# Patient Record
Sex: Male | Born: 1990 | Race: Black or African American | Hispanic: No | Marital: Single
Health system: Southern US, Community
[De-identification: ages and names within clinical notes are randomized; demographics above are authoritative.]

---

## 2015-09-01 ENCOUNTER — Emergency Department (HOSPITAL_COMMUNITY)
Admission: EM | Admit: 2015-09-01 | Discharge: 2015-09-12 | Disposition: E | Payer: Self-pay | Attending: Emergency Medicine | Admitting: Emergency Medicine

## 2015-09-01 DIAGNOSIS — Y929 Unspecified place or not applicable: Secondary | ICD-10-CM | POA: Insufficient documentation

## 2015-09-01 DIAGNOSIS — W3400XA Accidental discharge from unspecified firearms or gun, initial encounter: Secondary | ICD-10-CM

## 2015-09-01 DIAGNOSIS — Y999 Unspecified external cause status: Secondary | ICD-10-CM | POA: Insufficient documentation

## 2015-09-01 DIAGNOSIS — Y939 Activity, unspecified: Secondary | ICD-10-CM | POA: Insufficient documentation

## 2015-09-01 DIAGNOSIS — S21302A Unspecified open wound of left front wall of thorax with penetration into thoracic cavity, initial encounter: Secondary | ICD-10-CM | POA: Insufficient documentation

## 2015-09-01 DIAGNOSIS — S21332A Puncture wound without foreign body of left front wall of thorax with penetration into thoracic cavity, initial encounter: Secondary | ICD-10-CM

## 2015-09-01 MED ORDER — ETOMIDATE 2 MG/ML IV SOLN
INTRAVENOUS | Status: AC | PRN
  Administered 2015-09-01: 20 mg via INTRAVENOUS

## 2015-09-01 MED ORDER — ROCURONIUM BROMIDE 50 MG/5ML IV SOLN
INTRAVENOUS | Status: AC | PRN
  Administered 2015-09-01: 70 mg via INTRAVENOUS

## 2015-09-01 MED ORDER — SODIUM CHLORIDE 0.9 % IV SOLN
INTRAVENOUS | Status: AC | PRN
  Administered 2015-09-01 – 2015-09-02 (×3): 1000 mL via INTRAVENOUS
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
## 2015-09-02 ENCOUNTER — Emergency Department (HOSPITAL_COMMUNITY): Payer: Self-pay

## 2015-09-02 ENCOUNTER — Emergency Department (HOSPITAL_COMMUNITY): Payer: Self-pay | Admitting: Certified Registered"

## 2015-09-02 ENCOUNTER — Encounter (HOSPITAL_COMMUNITY): Admission: EM | Disposition: E | Payer: Self-pay | Source: Home / Self Care | Attending: Emergency Medicine

## 2015-09-02 ENCOUNTER — Encounter (HOSPITAL_COMMUNITY): Payer: Self-pay | Admitting: Certified Registered"

## 2015-09-02 DIAGNOSIS — S271XXA Traumatic hemothorax, initial encounter: Secondary | ICD-10-CM

## 2015-09-02 HISTORY — PX: ASD REPAIR: SHX258

## 2015-09-02 LAB — PREPARE CRYOPRECIPITATE
UNIT DIVISION: 0
Unit division: 0

## 2015-09-02 LAB — COMPREHENSIVE METABOLIC PANEL
ALBUMIN: 4.8 g/dL (ref 3.5–5.0)
ALK PHOS: 47 U/L (ref 38–126)
ALT: 23 U/L (ref 17–63)
AST: 32 U/L (ref 15–41)
Anion gap: 14 (ref 5–15)
BILIRUBIN TOTAL: 0.8 mg/dL (ref 0.3–1.2)
BUN: 26 mg/dL — AB (ref 6–20)
CALCIUM: 9.8 mg/dL (ref 8.9–10.3)
CO2: 22 mmol/L (ref 22–32)
CREATININE: 1.04 mg/dL (ref 0.61–1.24)
Chloride: 102 mmol/L (ref 101–111)
GFR calc Af Amer: >60 mL/min (ref >60)
GLUCOSE: 86 mg/dL (ref 65–99)
POTASSIUM: 4.1 mmol/L (ref 3.5–5.1)
Sodium: 138 mmol/L (ref 135–145)
TOTAL PROTEIN: 6.9 g/dL (ref 6.5–8.1)

## 2015-09-02 LAB — CBC
HCT: 42.2 % (ref 39.0–52.0)
Hemoglobin: 13.8 g/dL (ref 13.0–17.0)
MCH: 30.6 pg (ref 26.0–34.0)
MCHC: 32.7 g/dL (ref 30.0–36.0)
MCV: 93.6 fL (ref 78.0–100.0)
PLATELETS: 207 10*3/uL (ref 150–400)
RBC: 4.51 MIL/uL (ref 4.22–5.81)
RDW: 13.1 % (ref 11.5–15.5)
WBC: 13 10*3/uL — AB (ref 4.0–10.5)

## 2015-09-02 LAB — CDS SEROLOGY

## 2015-09-02 LAB — ABO/RH: ABO/RH(D): O POS

## 2015-09-02 LAB — PROTIME-INR
INR: 1.57 — AB (ref 0.00–1.49)
PROTHROMBIN TIME: 18.8 s — AB (ref 11.6–15.2)

## 2015-09-02 LAB — ETHANOL

## 2015-09-02 SURGERY — REPAIR, ATRIAL SEPTAL DEFECT
Anesthesia: General | Site: Chest

## 2015-09-02 MED ORDER — NITROGLYCERIN IN D5W 200-5 MCG/ML-% IV SOLN
2.0000 ug/min | INTRAVENOUS | Status: DC
  Filled 2015-09-02: qty 250

## 2015-09-02 MED ORDER — PAPAVERINE HCL 30 MG/ML IJ SOLN
INTRAMUSCULAR | Status: DC
  Filled 2015-09-02: qty 2.5

## 2015-09-02 MED ORDER — INSULIN REGULAR HUMAN 100 UNIT/ML IJ SOLN
INTRAMUSCULAR | Status: DC
  Filled 2015-09-02: qty 2.5

## 2015-09-02 MED ORDER — CALCIUM CHLORIDE 10 % IV SOLN
INTRAVENOUS | Status: DC | PRN
  Administered 2015-09-02: 1 g via INTRAVENOUS

## 2015-09-02 MED ORDER — EPINEPHRINE HCL 1 MG/ML IJ SOLN
INTRAMUSCULAR | Status: DC | PRN
  Administered 2015-09-02 (×7): 1 mg via INTRAVENOUS

## 2015-09-02 MED ORDER — SODIUM BICARBONATE 4.2 % IV SOLN
INTRAVENOUS | Status: DC | PRN
  Administered 2015-09-02: 50 meq via INTRAVENOUS
  Administered 2015-09-02: 100 meq via INTRAVENOUS

## 2015-09-02 MED ORDER — PHENYLEPHRINE HCL 10 MG/ML IJ SOLN
30.0000 ug/min | INTRAVENOUS | Status: DC
  Filled 2015-09-02: qty 2

## 2015-09-02 MED ORDER — 0.9 % SODIUM CHLORIDE (POUR BTL) OPTIME
TOPICAL | Status: DC | PRN
  Administered 2015-09-02: 3000 mL

## 2015-09-02 MED ORDER — SODIUM BICARBONATE 8.4 % IV SOLN
INTRAVENOUS | Status: AC | PRN
  Administered 2015-09-02 (×2): 50 meq via INTRAVENOUS

## 2015-09-02 MED ORDER — HEPARIN SODIUM (PORCINE) 1000 UNIT/ML IJ SOLN
INTRAMUSCULAR | Status: DC
  Filled 2015-09-02: qty 30

## 2015-09-02 MED ORDER — VANCOMYCIN HCL 10 G IV SOLR
1250.0000 mg | INTRAVENOUS | Status: DC
  Filled 2015-09-02: qty 1250

## 2015-09-02 MED ORDER — DOPAMINE-DEXTROSE 3.2-5 MG/ML-% IV SOLN
0.0000 ug/kg/min | INTRAVENOUS | Status: DC
  Filled 2015-09-02: qty 250

## 2015-09-02 MED ORDER — EPINEPHRINE HCL 0.1 MG/ML IJ SOSY
PREFILLED_SYRINGE | INTRAMUSCULAR | Status: AC | PRN
  Administered 2015-09-02 (×7): 1 via INTRAVENOUS

## 2015-09-02 MED ORDER — EPINEPHRINE HCL 1 MG/ML IJ SOLN
0.0000 ug/min | INTRAMUSCULAR | Status: DC
Start: 2015-09-02 — End: 2015-09-02
  Filled 2015-09-02: qty 4

## 2015-09-02 MED ORDER — MAGNESIUM SULFATE 50 % IJ SOLN
40.0000 meq | INTRAMUSCULAR | Status: DC
  Filled 2015-09-02: qty 10

## 2015-09-02 MED ORDER — FENTANYL CITRATE (PF) 250 MCG/5ML IJ SOLN
INTRAMUSCULAR | Status: AC
  Filled 2015-09-02: qty 5

## 2015-09-02 MED ORDER — DEXTROSE 5 % IV SOLN
750.0000 mg | INTRAVENOUS | Status: DC
  Filled 2015-09-02: qty 750

## 2015-09-02 MED ORDER — MIDAZOLAM HCL 2 MG/2ML IJ SOLN
INTRAMUSCULAR | Status: AC
  Filled 2015-09-02: qty 2

## 2015-09-02 MED ORDER — SODIUM CHLORIDE 0.9 % IV SOLN
INTRAVENOUS | Status: DC | PRN
  Administered 2015-09-02: 01:00:00 via INTRAVENOUS

## 2015-09-02 MED ORDER — FENTANYL CITRATE (PF) 100 MCG/2ML IJ SOLN
INTRAMUSCULAR | Status: AC | PRN
  Administered 2015-09-02: 50 ug via INTRAVENOUS

## 2015-09-02 MED ORDER — PROPOFOL 10 MG/ML IV BOLUS
INTRAVENOUS | Status: AC
  Filled 2015-09-02: qty 20

## 2015-09-02 MED ORDER — DEXMEDETOMIDINE HCL IN NACL 400 MCG/100ML IV SOLN
0.1000 ug/kg/h | INTRAVENOUS | Status: DC
  Filled 2015-09-02: qty 100

## 2015-09-02 MED ORDER — POTASSIUM CHLORIDE 2 MEQ/ML IV SOLN
80.0000 meq | INTRAVENOUS | Status: DC
  Filled 2015-09-02: qty 40

## 2015-09-02 MED ORDER — AMINOCAPROIC ACID 250 MG/ML IV SOLN
INTRAVENOUS | Status: DC
  Filled 2015-09-02: qty 40

## 2015-09-02 MED ORDER — DEXTROSE 5 % IV SOLN
1.5000 g | INTRAVENOUS | Status: DC
  Filled 2015-09-02: qty 1.5

## 2015-09-02 MED FILL — Sodium Chloride IV Soln 0.9%: INTRAVENOUS | Qty: 3000 | Status: AC

## 2015-09-02 MED FILL — Medication: Qty: 1 | Status: AC

## 2015-09-02 MED FILL — Heparin Sodium (Porcine) Inj 1000 Unit/ML: INTRAMUSCULAR | Qty: 30 | Status: AC

## 2015-09-02 SURGICAL SUPPLY — 86 items
ADAPTER CARDIO PERF ANTE/RETRO (ADAPTER) ×3 IMPLANT
BAG DECANTER FOR FLEXI CONT (MISCELLANEOUS) ×3 IMPLANT
BLADE STERNUM SYSTEM 6 (BLADE) ×6 IMPLANT
BLADE SURG 15 STRL LF DISP TIS (BLADE) ×1 IMPLANT
BLADE SURG 15 STRL SS (BLADE) ×2
BLADE SURG ROTATE 9660 (MISCELLANEOUS) IMPLANT
CANISTER SUCTION 2500CC (MISCELLANEOUS) ×3 IMPLANT
CANNULA ARTERIAL NVNT 3/8 22FR (MISCELLANEOUS) IMPLANT
CANNULA GUNDRY RCSP 15FR (MISCELLANEOUS) IMPLANT
CARDIOBLATE CARDIAC ABLATION (MISCELLANEOUS)
CATH HEART VENT LEFT (CATHETERS) ×1 IMPLANT
CATH RETROPLEGIA CORONARY 14FR (CATHETERS) IMPLANT
CATH ROBINSON RED A/P 18FR (CATHETERS) IMPLANT
CLIP TI MEDIUM 24 (CLIP) ×6 IMPLANT
CLIP TI WIDE RED SMALL 24 (CLIP) ×9 IMPLANT
CONN 1/2X1/2X1/2  BEN (MISCELLANEOUS) ×2
CONN 1/2X1/2X1/2 BEN (MISCELLANEOUS) ×1 IMPLANT
CONN 3/8X1/2 ST GISH (MISCELLANEOUS) ×6 IMPLANT
CONN Y 3/8X3/8X3/8  BEN (MISCELLANEOUS)
CONN Y 3/8X3/8X3/8 BEN (MISCELLANEOUS) IMPLANT
CRADLE DONUT ADULT HEAD (MISCELLANEOUS) ×3 IMPLANT
DEVICE CARDIOBLATE CARDIAC ABL (MISCELLANEOUS) IMPLANT
DRAPE CARDIOVASCULAR INCISE (DRAPES) ×2
DRAPE SLUSH/WARMER DISC (DRAPES) IMPLANT
DRAPE SRG 135X102X78XABS (DRAPES) ×1 IMPLANT
DRSG AQUACEL AG ADV 3.5X14 (GAUZE/BANDAGES/DRESSINGS) ×3 IMPLANT
ELECT CAUTERY BLADE 6.4 (BLADE) IMPLANT
ELECT REM PT RETURN 9FT ADLT (ELECTROSURGICAL) ×6
ELECTRODE REM PT RTRN 9FT ADLT (ELECTROSURGICAL) ×2 IMPLANT
GAUZE SPONGE 4X4 12PLY STRL (GAUZE/BANDAGES/DRESSINGS) ×6 IMPLANT
GLOVE BIO SURGEON STRL SZ 6 (GLOVE) ×3 IMPLANT
GOWN STRL REUS W/ TWL LRG LVL3 (GOWN DISPOSABLE) ×4 IMPLANT
GOWN STRL REUS W/TWL LRG LVL3 (GOWN DISPOSABLE) ×8
HEMOSTAT POWDER SURGIFOAM 1G (HEMOSTASIS) IMPLANT
INSERT FOGARTY XLG (MISCELLANEOUS) IMPLANT
KIT BASIN OR (CUSTOM PROCEDURE TRAY) ×3 IMPLANT
KIT ROOM TURNOVER OR (KITS) ×3 IMPLANT
KIT SUCTION CATH 14FR (SUCTIONS) IMPLANT
NS IRRIG 1000ML POUR BTL (IV SOLUTION) ×12 IMPLANT
PACK OPEN HEART (CUSTOM PROCEDURE TRAY) ×3 IMPLANT
PAD ARMBOARD 7.5X6 YLW CONV (MISCELLANEOUS) ×6 IMPLANT
STAPLER VISISTAT 35W (STAPLE) ×3 IMPLANT
STOPCOCK 4 WAY LG BORE MALE ST (IV SETS) ×6 IMPLANT
SUCKER WEIGHTED FLEX (MISCELLANEOUS) ×3 IMPLANT
SUT ETHIBOND 2 0 SH (SUTURE) ×11 IMPLANT
SUT ETHIBOND 2 0 SH 36X2 (SUTURE) ×4 IMPLANT
SUT ETHIBOND 2 0 V4 (SUTURE) ×3 IMPLANT
SUT ETHIBOND 2 0V4 GREEN (SUTURE) ×6 IMPLANT
SUT PROLENE 3 0 SH DA (SUTURE) ×3 IMPLANT
SUT PROLENE 3 0 SH1 36 (SUTURE) ×3 IMPLANT
SUT PROLENE 4 0 RB 1 (SUTURE) ×10
SUT PROLENE 4-0 RB1 .5 CRCL 36 (SUTURE) ×5 IMPLANT
SUT PROLENE 5 0 C 1 36 (SUTURE) ×12 IMPLANT
SUT PROLENE 5 0 CC1 (SUTURE) ×3 IMPLANT
SUT PROLENE 6 0 C 1 30 (SUTURE) ×6 IMPLANT
SUT PROLENE 8 0 BV175 6 (SUTURE) ×6 IMPLANT
SUT PROLENE BLUE 7 0 (SUTURE) ×3 IMPLANT
SUT SILK  1 MH (SUTURE) ×8
SUT SILK 1 MH (SUTURE) ×4 IMPLANT
SUT SILK 1 TIES 10X30 (SUTURE) ×6 IMPLANT
SUT SILK 2 0 (SUTURE) ×2
SUT SILK 2 0 SH CR/8 (SUTURE) ×12 IMPLANT
SUT SILK 2 0 TIES 10X30 (SUTURE) ×3 IMPLANT
SUT SILK 2 0 TIES 17X18 (SUTURE) ×2
SUT SILK 2-0 18XBRD TIE 12 (SUTURE) ×1 IMPLANT
SUT SILK 2-0 18XBRD TIE BLK (SUTURE) ×1 IMPLANT
SUT SILK 3 0 SH CR/8 (SUTURE) ×6 IMPLANT
SUT SILK 4 0 (SUTURE) ×2
SUT SILK 4 0 TIE 10X30 (SUTURE) ×6 IMPLANT
SUT SILK 4-0 18XBRD TIE 12 (SUTURE) ×1 IMPLANT
SUT STEEL 6MS V (SUTURE) IMPLANT
SUT TEM PAC WIRE 2 0 SH (SUTURE) ×12 IMPLANT
SUT VIC AB 1 CTX 36 (SUTURE) ×4
SUT VIC AB 1 CTX36XBRD ANBCTR (SUTURE) ×2 IMPLANT
SUT VIC AB 2-0 CTX 27 (SUTURE) ×6 IMPLANT
SUT VIC AB 3-0 X1 27 (SUTURE) ×6 IMPLANT
SUT VICRYL 2 TP 1 (SUTURE) ×3 IMPLANT
SYSTEM SAHARA CHEST DRAIN ATS (WOUND CARE) ×3 IMPLANT
TAPE UMBILICAL COTTON 1/8X30 (MISCELLANEOUS) ×3 IMPLANT
TOWEL OR 17X24 6PK STRL BLUE (TOWEL DISPOSABLE) ×3 IMPLANT
TOWEL OR 17X26 10 PK STRL BLUE (TOWEL DISPOSABLE) ×3 IMPLANT
TRAY FOLEY IC TEMP SENS 14FR (CATHETERS) ×3 IMPLANT
TUBING ART PRESS 48 MALE/FEM (TUBING) ×3 IMPLANT
UNDERPAD 30X30 INCONTINENT (UNDERPADS AND DIAPERS) ×3 IMPLANT
VENT LEFT HEART 12002 (CATHETERS) ×3
WATER STERILE IRR 1000ML POUR (IV SOLUTION) ×6 IMPLANT

## 2015-09-03 ENCOUNTER — Encounter (HOSPITAL_COMMUNITY): Payer: Self-pay | Admitting: Cardiothoracic Surgery

## 2015-09-03 LAB — PREPARE FRESH FROZEN PLASMA
UNIT DIVISION: 0
UNIT DIVISION: 0
UNIT DIVISION: 0
UNIT DIVISION: 0
UNIT DIVISION: 0
Unit division: 0
Unit division: 0
Unit division: 0
Unit division: 0
Unit division: 0
Unit division: 0
Unit division: 0

## 2015-09-03 LAB — TYPE AND SCREEN
ABO/RH(D): O POS
Antibody Screen: NEGATIVE
UNIT DIVISION: 0
UNIT DIVISION: 0
UNIT DIVISION: 0
UNIT DIVISION: 0
UNIT DIVISION: 0
UNIT DIVISION: 0
UNIT DIVISION: 0
UNIT DIVISION: 0
Unit division: 0
Unit division: 0
Unit division: 0
Unit division: 0
Unit division: 0
Unit division: 0

## 2015-09-03 LAB — PREPARE PLATELET PHERESIS
Unit division: 0
Unit division: 0
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
## 2015-09-12 NOTE — Code Documentation (Signed)
UNIT #5 PRBC STARTED UNIT [id number]

## 2015-09-12 NOTE — OR Nursing (Signed)
Time of death 35 called by Dr. Donata Clay. Medical Examiner notified.  Donor notified.

## 2015-09-12 NOTE — Code Documentation (Signed)
Pulse check; pulse present  

## 2015-09-12 NOTE — ED Notes (Signed)
1ST UNIT PLASMA STARTED UNIT # [id number]

## 2015-09-12 NOTE — OR Nursing (Signed)
[facility] Reference number [id number]. Spoke to [employer].

## 2015-09-12 NOTE — Code Documentation (Signed)
  EPI GIVEN INTO HEART

## 2015-09-12 NOTE — Code Documentation (Signed)
CHEST OPENED
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
## 2015-09-12 NOTE — ED Notes (Signed)
2ND UNIT BLOOD STARTED UNIT #[id number]

## 2015-09-12 NOTE — ED Notes (Signed)
800 CC BRIGHT RED BLOOD OUT OF PLEUROVAC.
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
## 2015-09-12 NOTE — ED Notes (Signed)
2ND UNIT PLASMA FINISHED

## 2015-09-12 NOTE — Code Documentation (Signed)
3RD UNIT PLASMA STARTED UNIT W [id number] 

## 2015-09-12 NOTE — Code Documentation (Signed)
5TH UNIT PRBC FINISHED

## 2015-09-12 NOTE — Anesthesia Postprocedure Evaluation (Signed)
  Anesthesia Post-op Note  Patient: Victor Gay XXXYaacoub Babikir  Procedure(s) Performed: Procedure(s): Chest exploration (N/A)     Time of death 14

## 2015-09-12 NOTE — Code Documentation (Signed)
EPI GIVEN INTO HEART
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
## 2015-09-12 NOTE — Code Documentation (Signed)
3RD UNIT PLASMA STARTED UNIT W [id number]

## 2015-09-12 NOTE — ED Notes (Signed)
2ND UNIT PRBC COMPLETE

## 2015-09-12 NOTE — ED Notes (Signed)
Paged Cardiothoracic surg to [phone] @ [phone]

## 2015-09-12 NOTE — ED Notes (Signed)
1ST UNIT BLOOD FINISHED.

## 2015-09-12 NOTE — Code Documentation (Signed)
PLASMA #4FINISHED

## 2015-09-12 NOTE — Anesthesia Preprocedure Evaluation (Addendum)
Anesthesia Evaluation  Patient identified by MRN, date of birth, ID band Patient unresponsive    Reviewed: Unable to perform ROS - Chart review onlyPreop documentation limited or incomplete due to emergent nature of procedure.  Airway       Comment: ETT in situ Dental   Pulmonary           Cardiovascular      Neuro/Psych    GI/Hepatic   Endo/Other    Renal/GU      Musculoskeletal   Abdominal   Peds  Hematology   Anesthesia Other Findings   Reproductive/Obstetrics                             Anesthesia Physical Anesthesia Plan  ASA: V and emergent  Anesthesia Plan: General   Post-op Pain Management:    Induction: Intravenous and Inhalational  Airway Management Planned: Oral ETT  Additional Equipment: Arterial line, CVP and 3D TEE  Intra-op Plan:   Post-operative Plan: Post-operative intubation/ventilation  Informed Consent:   Plan Discussed with: Anesthesiologist and Surgeon  Anesthesia Plan Comments:         Anesthesia Quick Evaluation
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
## 2015-09-12 NOTE — ED Notes (Signed)
3RD UNIT PRBC STARTED UNIT [id number]

## 2015-09-12 NOTE — ED Notes (Signed)
6TH UNIR PRBC STARTED [id number]

## 2015-09-12 NOTE — Code Documentation (Signed)
THORACIC SURGEON MASSAGING HEART

## 2015-09-12 NOTE — ED Notes (Signed)
CALLED BLOOD BANK AND ACTIVATED MASSIVE BLOOD TRANSFUSION PROTOCOL PER DR Bebe Shaggy.

## 2015-09-12 NOTE — ED Notes (Signed)
1ST UNIT PLASMA Victor Gay

## 2015-09-12 NOTE — Code Documentation (Signed)
3RD UNIT PLASMA FINISHED

## 2015-09-12 NOTE — ED Notes (Signed)
1ST UNIT OF BLOOD STARTED UNIT #[id number]
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
## 2015-09-12 NOTE — Code Documentation (Signed)
3RD UNIT prbc FINISHED.

## 2015-09-12 NOTE — Code Documentation (Signed)
4TH UNIT PLASMA STARTED UNIT [id number]
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
## 2015-09-12 NOTE — Transfer of Care (Signed)
Immediate Anesthesia Transfer of Care Note  Patient: Victor Gay  Procedure(s) Performed: Procedure(s): Chest exploration (N/A)  Time of death 34

## 2015-09-12 NOTE — Code Documentation (Signed)
1MG EPI GIVEN INTO HEART 

## 2015-09-12 NOTE — Code Documentation (Signed)
4TH UNIT PRBC FINISHED.

## 2015-09-12 NOTE — ED Notes (Signed)
2ND UNIT PLASMA UNIT [id number] STARTED

## 2015-09-12 NOTE — Code Documentation (Signed)
PULSES LOST, CPR STARTED

## 2015-09-12 DEATH — deceased
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
